# Patient Record
Sex: Male | Born: 1990 | Race: White | Hispanic: No | Marital: Single | Smoking: Current every day smoker
Health system: Southern US, Community
[De-identification: ages and names within clinical notes are randomized; demographics above are authoritative.]

---

## 2016-11-19 ENCOUNTER — Encounter (HOSPITAL_COMMUNITY): Payer: Self-pay | Admitting: *Deleted

## 2016-11-19 ENCOUNTER — Emergency Department (HOSPITAL_COMMUNITY): Payer: No Typology Code available for payment source

## 2016-11-19 ENCOUNTER — Emergency Department (HOSPITAL_COMMUNITY)
Admission: EM | Admit: 2016-11-19 | Discharge: 2016-11-19 | Disposition: A | Discharge status: Home / Self Care | Payer: No Typology Code available for payment source | Attending: Emergency Medicine | Admitting: Emergency Medicine

## 2016-11-19 DIAGNOSIS — Y92410 Unspecified street and highway as the place of occurrence of the external cause: Secondary | ICD-10-CM | POA: Insufficient documentation

## 2016-11-19 DIAGNOSIS — Y999 Unspecified external cause status: Secondary | ICD-10-CM | POA: Insufficient documentation

## 2016-11-19 DIAGNOSIS — Y939 Activity, unspecified: Secondary | ICD-10-CM | POA: Insufficient documentation

## 2016-11-19 DIAGNOSIS — F17210 Nicotine dependence, cigarettes, uncomplicated: Secondary | ICD-10-CM | POA: Insufficient documentation

## 2016-11-19 DIAGNOSIS — M79672 Pain in left foot: Secondary | ICD-10-CM | POA: Diagnosis present

## 2016-11-19 DIAGNOSIS — M542 Cervicalgia: Secondary | ICD-10-CM | POA: Insufficient documentation

## 2016-11-19 DIAGNOSIS — R1012 Left upper quadrant pain: Secondary | ICD-10-CM | POA: Diagnosis not present

## 2016-11-19 DIAGNOSIS — R109 Unspecified abdominal pain: Secondary | ICD-10-CM | POA: Insufficient documentation

## 2016-11-19 LAB — CBC
HCT: 47.7 % (ref 39.0–52.0)
Hemoglobin: 16.9 g/dL (ref 13.0–17.0)
MCH: 33.7 pg (ref 26.0–34.0)
MCHC: 35.4 g/dL (ref 30.0–36.0)
MCV: 95 fL (ref 78.0–100.0)
PLATELETS: 232 10*3/uL (ref 150–400)
RBC: 5.02 MIL/uL (ref 4.22–5.81)
RDW: 12.3 % (ref 11.5–15.5)
WBC: 11.9 10*3/uL — ABNORMAL HIGH (ref 4.0–10.5)

## 2016-11-19 LAB — URINALYSIS, ROUTINE W REFLEX MICROSCOPIC
Bilirubin Urine: NEGATIVE
Glucose, UA: NEGATIVE mg/dL
Hgb urine dipstick: NEGATIVE
KETONES UR: NEGATIVE mg/dL
LEUKOCYTES UA: NEGATIVE
NITRITE: NEGATIVE
PH: 7 (ref 5.0–8.0)
Protein, ur: NEGATIVE mg/dL
Specific Gravity, Urine: 1.009 (ref 1.005–1.030)

## 2016-11-19 LAB — COMPREHENSIVE METABOLIC PANEL
ALT: 20 U/L (ref 17–63)
ANION GAP: 10 (ref 5–15)
AST: 28 U/L (ref 15–41)
Albumin: 4.7 g/dL (ref 3.5–5.0)
Alkaline Phosphatase: 71 U/L (ref 38–126)
BUN: 11 mg/dL (ref 6–20)
CHLORIDE: 99 mmol/L — AB (ref 101–111)
CO2: 26 mmol/L (ref 22–32)
Calcium: 9.6 mg/dL (ref 8.9–10.3)
Creatinine, Ser: 0.86 mg/dL (ref 0.61–1.24)
Glucose, Bld: 108 mg/dL — ABNORMAL HIGH (ref 65–99)
Potassium: 3.8 mmol/L (ref 3.5–5.1)
SODIUM: 135 mmol/L (ref 135–145)
Total Bilirubin: 0.7 mg/dL (ref 0.3–1.2)
Total Protein: 7.3 g/dL (ref 6.5–8.1)

## 2016-11-19 LAB — LIPASE, BLOOD: LIPASE: 53 U/L — AB (ref 11–51)

## 2016-11-19 MED ORDER — IBUPROFEN 400 MG PO TABS
600.0000 mg | ORAL_TABLET | Freq: Once | ORAL | Status: AC
  Administered 2016-11-19: 600 mg via ORAL
  Filled 2016-11-19: qty 1

## 2016-11-19 MED ORDER — IBUPROFEN 600 MG PO TABS: 600.0000 mg | ORAL_TABLET | Freq: Four times a day (QID) | ORAL | 0 refills | Status: AC | PRN

## 2016-11-19 MED ORDER — METHOCARBAMOL 500 MG PO TABS: 500.0000 mg | ORAL_TABLET | Freq: Two times a day (BID) | ORAL | 0 refills | Status: AC

## 2016-11-19 MED ORDER — IOPAMIDOL (ISOVUE-300) INJECTION 61%
INTRAVENOUS | Status: AC
  Administered 2016-11-19: 100 mL
  Filled 2016-11-19: qty 100
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
## 2016-11-19 NOTE — ED Notes (Signed)
Returned from ct scan 

## 2016-11-19 NOTE — Discharge Instructions (Signed)
Your workup is reassuring, all imaging is negative for acute fractures or injury. The pain your experiencing is likely due to muscle strain, you may take Ibuprofen and Robaxin as needed for pain management. Do not combine with any pain reliever other than tylenol. The muscle soreness should improve over the next week. Follow up with your family doctor in the next week for a recheck if you are still having symptoms. Return to ED if pain is worsening, you develop weakness or numbness of extremities, or new or concerning symptoms develop.
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
